# Patient Record
Sex: Female | Born: 1990 | Race: Black or African American | Hispanic: No | Marital: Single | Smoking: Never smoker
Health system: Southern US, Community
[De-identification: ages and names within clinical notes are randomized; demographics above are authoritative.]

## PROBLEM LIST (undated history)

## (undated) HISTORY — PX: GASTRIC BYPASS: SHX52

## (undated) HISTORY — PX: LARYNGEAL PAPILLOMA EXCISION LASER W/ MLB: SHX1942

## (undated) HISTORY — PX: CHOLECYSTECTOMY: SHX55

---
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
## 2008-01-27 ENCOUNTER — Emergency Department: Payer: Self-pay | Admitting: Emergency Medicine

## 2008-01-28 ENCOUNTER — Emergency Department: Payer: Self-pay | Admitting: Emergency Medicine

## 2008-04-16 ENCOUNTER — Emergency Department: Payer: Self-pay | Admitting: Unknown Physician Specialty

## 2008-12-04 ENCOUNTER — Emergency Department: Payer: Self-pay | Admitting: Internal Medicine

## 2009-02-06 ENCOUNTER — Emergency Department: Payer: Self-pay | Admitting: Emergency Medicine

## 2011-08-02 ENCOUNTER — Emergency Department: Payer: Self-pay | Admitting: Emergency Medicine

## 2013-10-23 ENCOUNTER — Emergency Department: Payer: Self-pay | Admitting: Emergency Medicine

## 2014-02-15 ENCOUNTER — Emergency Department: Payer: Self-pay | Admitting: Internal Medicine

## 2015-01-05 ENCOUNTER — Emergency Department
Admit: 2015-01-05 | Disposition: A | Discharge status: Home / Self Care | Payer: Self-pay | Admitting: Emergency Medicine

## 2016-12-06 ENCOUNTER — Emergency Department: Payer: No Typology Code available for payment source

## 2016-12-06 ENCOUNTER — Encounter: Payer: Self-pay | Admitting: Emergency Medicine

## 2016-12-06 ENCOUNTER — Emergency Department
Admission: EM | Admit: 2016-12-06 | Discharge: 2016-12-06 | Disposition: A | Discharge status: Home / Self Care | Payer: No Typology Code available for payment source | Attending: Emergency Medicine | Admitting: Emergency Medicine

## 2016-12-06 DIAGNOSIS — R51 Headache: Secondary | ICD-10-CM | POA: Diagnosis not present

## 2016-12-06 DIAGNOSIS — M542 Cervicalgia: Secondary | ICD-10-CM | POA: Diagnosis not present

## 2016-12-06 DIAGNOSIS — M7918 Myalgia, other site: Secondary | ICD-10-CM

## 2016-12-06 DIAGNOSIS — S199XXA Unspecified injury of neck, initial encounter: Secondary | ICD-10-CM | POA: Diagnosis present

## 2016-12-06 DIAGNOSIS — M545 Low back pain: Secondary | ICD-10-CM | POA: Insufficient documentation

## 2016-12-06 DIAGNOSIS — Y999 Unspecified external cause status: Secondary | ICD-10-CM | POA: Diagnosis not present

## 2016-12-06 DIAGNOSIS — Y92410 Unspecified street and highway as the place of occurrence of the external cause: Secondary | ICD-10-CM | POA: Insufficient documentation

## 2016-12-06 DIAGNOSIS — Y9389 Activity, other specified: Secondary | ICD-10-CM | POA: Diagnosis not present

## 2016-12-06 MED ORDER — NAPROXEN 500 MG PO TABS: 500.0000 mg | ORAL_TABLET | Freq: Two times a day (BID) | ORAL | 0 refills | Status: AC

## 2016-12-06 MED ORDER — IBUPROFEN 600 MG PO TABS
600.0000 mg | ORAL_TABLET | Freq: Once | ORAL | Status: AC
  Administered 2016-12-06: 600 mg via ORAL
  Filled 2016-12-06: qty 1

## 2016-12-06 MED ORDER — CYCLOBENZAPRINE HCL 5 MG PO TABS: 5.0000 mg | ORAL_TABLET | Freq: Three times a day (TID) | ORAL | 0 refills | Status: AC | PRN
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
## 2019-04-26 ENCOUNTER — Other Ambulatory Visit: Payer: Self-pay

## 2019-04-26 DIAGNOSIS — Z20822 Contact with and (suspected) exposure to covid-19: Secondary | ICD-10-CM

## 2019-04-27 LAB — NOVEL CORONAVIRUS, NAA: SARS-CoV-2, NAA: DETECTED — AB

## 2019-05-30 ENCOUNTER — Other Ambulatory Visit: Payer: Self-pay

## 2019-06-03 ENCOUNTER — Ambulatory Visit (LOCAL_COMMUNITY_HEALTH_CENTER): Payer: Self-pay

## 2019-06-03 ENCOUNTER — Other Ambulatory Visit: Payer: Self-pay

## 2019-06-03 DIAGNOSIS — Z111 Encounter for screening for respiratory tuberculosis: Secondary | ICD-10-CM

## 2019-06-06 ENCOUNTER — Other Ambulatory Visit: Payer: Self-pay

## 2019-06-06 ENCOUNTER — Ambulatory Visit (LOCAL_COMMUNITY_HEALTH_CENTER): Payer: Self-pay

## 2019-06-06 DIAGNOSIS — Z111 Encounter for screening for respiratory tuberculosis: Secondary | ICD-10-CM

## 2019-06-06 LAB — TB SKIN TEST
Induration: 0 mm
TB Skin Test: NEGATIVE

## 2019-12-20 ENCOUNTER — Ambulatory Visit: Payer: Medicaid Other | Attending: Internal Medicine

## 2019-12-20 ENCOUNTER — Other Ambulatory Visit: Payer: Self-pay

## 2019-12-20 DIAGNOSIS — Z23 Encounter for immunization: Secondary | ICD-10-CM

## 2019-12-20 NOTE — Progress Notes (Signed)
   Covid-19 Vaccination Clinic  Name:  JOY REIGER    MRN: [id number] DOB: 05/04/1991  12/20/2019  Ms. Elizondo was observed post Covid-19 immunization for 15 minutes without incident. She was provided with Vaccine Information Sheet and instruction to access the V-Safe system.   Ms. Junkins was instructed to call 911 with any severe reactions post vaccine: Marland Kitchen Difficulty breathing  . Swelling of face and throat  . A fast heartbeat  . A bad rash all over body  . Dizziness and weakness   Immunizations Administered    Name Date Dose VIS Date Route   Pfizer COVID-19 Vaccine 12/20/2019  9:02 AM 0.3 mL 08/22/2019 Intramuscular   Manufacturer: [employer], [employer]   Lot: [id number]   NDC: [address]-2

## 2020-01-14 ENCOUNTER — Ambulatory Visit: Payer: Medicaid Other | Attending: Internal Medicine

## 2020-01-14 DIAGNOSIS — Z23 Encounter for immunization: Secondary | ICD-10-CM

## 2020-01-14 NOTE — Progress Notes (Signed)
   Covid-19 Vaccination Clinic  Name:  Robin Newton    MRN: [id number] DOB: [birth year]  01/14/2020  [name] was observed post Covid-19 immunization for 15 minutes without incident. She was provided with Vaccine Information Sheet and instruction to access the V-Safe system.   [name] was instructed to call 911 with any severe reactions post vaccine: Marland Kitchen Difficulty breathing  . Swelling of face and throat  . A fast heartbeat  . A bad rash all over body  . Dizziness and weakness   Immunizations Administered    Name Date Dose VIS Date Route   Pfizer COVID-19 Vaccine 01/14/2020  4:31 PM 0.3 mL 11/05/2018 Intramuscular   Manufacturer: [employer], [employer]   Lot: [id number]   NDC: [address]-3

## 2020-08-30 ENCOUNTER — Other Ambulatory Visit: Payer: Self-pay

## 2020-08-30 ENCOUNTER — Emergency Department
Admission: EM | Admit: 2020-08-30 | Discharge: 2020-08-30 | Disposition: A | Discharge status: Home / Self Care | Payer: BLUE CROSS/BLUE SHIELD | Attending: Internal Medicine | Admitting: Internal Medicine

## 2020-08-30 ENCOUNTER — Encounter: Payer: Self-pay | Admitting: Emergency Medicine

## 2020-08-30 DIAGNOSIS — N3000 Acute cystitis without hematuria: Secondary | ICD-10-CM | POA: Diagnosis not present

## 2020-08-30 DIAGNOSIS — R300 Dysuria: Secondary | ICD-10-CM | POA: Diagnosis present

## 2020-08-30 LAB — URINALYSIS, ROUTINE W REFLEX MICROSCOPIC
Bilirubin Urine: NEGATIVE
Glucose, UA: NEGATIVE mg/dL
Ketones, ur: 5 mg/dL — AB
Nitrite: NEGATIVE
Protein, ur: 100 mg/dL — AB
Specific Gravity, Urine: 1.027 (ref 1.005–1.030)
pH: 5 (ref 5.0–8.0)

## 2020-08-30 LAB — PREGNANCY, URINE: Preg Test, Ur: NEGATIVE

## 2020-08-30 MED ORDER — NITROFURANTOIN MONOHYD MACRO 100 MG PO CAPS
100.0000 mg | ORAL_CAPSULE | Freq: Once | ORAL | Status: AC
  Administered 2020-08-30: 100 mg via ORAL
  Filled 2020-08-30: qty 1

## 2020-08-30 MED ORDER — NITROFURANTOIN MONOHYD MACRO 100 MG PO CAPS: 100.0000 mg | ORAL_CAPSULE | Freq: Two times a day (BID) | ORAL | 0 refills | Status: AC

## 2020-08-30 NOTE — Discharge Instructions (Signed)
You were seen today for acute urinary urgency and retention, consistent with a urinary tract infection.  We gave you your first dose of antibiotics in the ER.  I am giving a prescription for Macrobid to take twice daily for the next 3 days.  Please drink plenty of water.  Follow-up for new or worsening symptoms.

## 2020-08-30 NOTE — ED Notes (Signed)
Called lab to see why urine had not resulted. They have not ran test yet, did not see orders. Per Jaynie Collins they will run urine now.

## 2020-08-30 NOTE — ED Triage Notes (Signed)
Pt to ED via POV with c/o dysuria x 3 days. Pt states burning with urination at this time.

## 2020-08-30 NOTE — ED Provider Notes (Addendum)
[facility] Emergency Department Provider Note ____________________________________________  Time seen: 1510  I have reviewed the triage vital signs and the nursing notes.  HISTORY  Chief Complaint  Dysuria   HPI Robin Newton is a 29 y.o. female presents to the ER today with c/o urinary urgency and retention. This started last night. She denies frequency, dysuria, or blood in her urine. She denies vaginal discharge, odor, abnormal uterine bleeding. 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 She denies fever, chills, low back pain or lower abdominal pain. She is sexually active but has no concerns about STI's at this time.  History reviewed. No pertinent past medical history.  There are no problems to display for this patient.   Past Surgical History:  Procedure Laterality Date  . CHOLECYSTECTOMY    . LARYNGEAL PAPILLOMA EXCISION LASER W/ MLB     At age 80 and 80    Prior to Admission medications   Medication Sig Start Date End Date Taking? Authorizing Provider  nitrofurantoin, macrocrystal-monohydrate, (MACROBID) 100 MG capsule Take 1 capsule (100 mg total) by mouth 2 (two) times daily for 7 days. 08/30/20 09/06/20  Lorre Munroe, NP    Allergies Patient has no known allergies.  History reviewed. No pertinent family history.  Social History Social History   Tobacco Use  . Smoking status: Never Smoker  . Smokeless tobacco: Never Used  Substance Use Topics  . Alcohol use: No  . Drug use: No    Review of Systems  Constitutional: Negative for fever, chills or body aches. Cardiovascular: Negative for chest pain or chest tightness. Respiratory: Negative for cough or shortness of breath. Gastrointestinal: Negative for abdominal pain, nausea or vomiting. Genitourinary: Positive for urinary urgency and retention. Denies frequency, dysuria, blood in her urine, vaginal  discharge, odor or abnormal bleeding.  Skin: Negative for rash or lesion.  ____________________________________________  PHYSICAL EXAM:  VITAL SIGNS: ED Triage Vitals  Enc Vitals Group     BP 08/30/20 1405 132/80     Pulse Rate 08/30/20 1405 74     Resp 08/30/20 1405 19     Temp 08/30/20 1405 98.6 F (37 C)     Temp Source 08/30/20 1405 Oral     SpO2 08/30/20 1405 100 %     Weight 08/30/20 1408 (!) 348 lb (157.9 kg)     Height 08/30/20 1408 5\' 5"  (1.651 m)     Head Circumference --      Peak Flow --      Pain Score 08/30/20 1410 3     Pain Loc --      Pain Edu? --      Excl. in GC? --     Constitutional: Alert and oriented. Obese, in no distress. Head: Normocephalic and atraumatic. Eyes: Conjunctivae are normal. PERRL. Normal extraocular movements Cardiovascular: Normal rate, regular rhythm.  Respiratory: Normal respiratory effort. No wheezes/rales/rhonchi. Gastrointestinal: Soft and nontender. No distention. No CVA tenderness noted.  Neurologic:  Normal gait without ataxia. Normal speech and language. No gross focal neurologic deficits are appreciated.  ____________________________________________ LABS  Urinalysis    Component Value Date/Time   COLORURINE YELLOW (A) 08/30/2020 1523   APPEARANCEUR TURBID (A) 08/30/2020 1523   LABSPEC 1.027 08/30/2020 1523   PHURINE 5.0 08/30/2020 1523   GLUCOSEU NEGATIVE 08/30/2020 1523   HGBUR SMALL (A) 08/30/2020 1523   BILIRUBINUR NEGATIVE 08/30/2020 1523   KETONESUR 5 (A) 08/30/2020 1523  PROTEINUR 100 (A) 08/30/2020 1523   NITRITE NEGATIVE 08/30/2020 1523   LEUKOCYTESUR LARGE (A) 08/30/2020 1523     ____________________________________________    INITIAL IMPRESSION / ASSESSMENT AND PLAN / ED COURSE  Urinary Urgency, Urinary Retention:  DDX include interstitial cystitis, UTI, kidney stone Urinalysis, reflux with culture c/w infection Macrobid 100 mg PO x 1 RX for Macrobid PO BID x 3 days Push fluids Follow up  with new PCP or return to ER for new or worsening symptoms ____________________________________________  FINAL CLINICAL IMPRESSION(S) / ED DIAGNOSES  Final diagnoses:  Acute cystitis without hematuria    Nicki Reaper, NP    Lorre Munroe, NP 08/30/20 1655    Lorre Munroe, NP 08/30/20 2043    Delton Prairie, MD 08/30/20 2314

## 2020-09-05 ENCOUNTER — Telehealth: Payer: Self-pay | Admitting: Emergency Medicine

## 2020-09-05 MED ORDER — METRONIDAZOLE 500 MG PO TABS: 500.0000 mg | ORAL_TABLET | Freq: Two times a day (BID) | ORAL | 0 refills | Status: AC

## 2020-09-05 MED ORDER — FLUCONAZOLE 100 MG PO TABS: 150.0000 mg | ORAL_TABLET | Freq: Every day | ORAL | 0 refills | Status: AC

## 2020-09-05 NOTE — Telephone Encounter (Cosign Needed)
Patient called stating that she was seen in this ED a couple of days ago.  She has been taking Macrobid for the UTI that she was diagnosed with.  She states that she was just notified on my chart that there was trichomonas in her urine.  She called and spoke with an RN, who told her that she would need a prescription for the trichomonas.  I am sending over a prescription for Flagyl for the Trichomonas and also a prescription for Diflucan for the budding yeast in her urine.

## 2020-10-16 ENCOUNTER — Encounter: Payer: Self-pay | Admitting: Emergency Medicine

## 2020-10-16 ENCOUNTER — Other Ambulatory Visit: Payer: Self-pay

## 2020-10-16 ENCOUNTER — Ambulatory Visit
Admission: EM | Admit: 2020-10-16 | Discharge: 2020-10-16 | Disposition: A | Discharge status: Home / Self Care | Payer: BLUE CROSS/BLUE SHIELD | Attending: Sports Medicine | Admitting: Sports Medicine

## 2020-10-16 DIAGNOSIS — R300 Dysuria: Secondary | ICD-10-CM | POA: Diagnosis present

## 2020-10-16 DIAGNOSIS — R350 Frequency of micturition: Secondary | ICD-10-CM | POA: Diagnosis present

## 2020-10-16 DIAGNOSIS — N390 Urinary tract infection, site not specified: Secondary | ICD-10-CM | POA: Diagnosis present

## 2020-10-16 LAB — URINALYSIS, COMPLETE (UACMP) WITH MICROSCOPIC
Glucose, UA: NEGATIVE mg/dL
Ketones, ur: 15 mg/dL — AB
Nitrite: NEGATIVE
Protein, ur: 30 mg/dL — AB
Specific Gravity, Urine: >1.03 — ABNORMAL HIGH (ref 1.005–1.030)
WBC, UA: >50 WBC/hpf (ref 0–5)
pH: 5.5 (ref 5.0–8.0)

## 2020-10-16 MED ORDER — SULFAMETHOXAZOLE-TRIMETHOPRIM 800-160 MG PO TABS: 1.0000 | ORAL_TABLET | Freq: Two times a day (BID) | ORAL | 0 refills | Status: AC

## 2020-10-16 NOTE — ED Triage Notes (Signed)
Patient c/o urinary frequency and urgency and dysuria that started 3 days ago.  Patient denies fevers.  Patient denies N/V.

## 2020-10-16 NOTE — Discharge Instructions (Addendum)
Please take the medication as prescribed. Your urine has been sent off for culture and someone may contact you to change your antibiotics. Please flush her system with plenty of fluids including water.  Your urine shows you are dehydrated. Your urine also shows a moderate amount of bilirubin which may be secondary to your dehydration.  This was not on your last urine from December.  Please reach out to your weight loss surgeon and let them know of this finding as it may be secondary to your weight loss surgery.  I hope you get feeling better, Dr. Zachery Dauer

## 2020-10-16 NOTE — ED Provider Notes (Signed)
[facility]    CSN: [id number] Arrival date & time: 10/16/20  1429      History   Chief Complaint Chief Complaint  Patient presents with  . Urinary Frequency  . Dysuria    HPI Robin Newton is a 30 y.o. female.   Patient pleasant 30 year old female who presents for evaluation of the above issues.  She reports 3 days of increased urinary frequency, incomplete voiding, pain on urination with burning, but no hematuria.  She did have a urinary tract infection diagnosed back on 08/31/1999 and Macrobid for 3 days.  The UA did reveal trichomonas and she was also put on Flagyl.  She denies any fever shakes chills.  No significant abdominal pain.  She denies chest pain or shortness of breath.  No Covid symptoms.  No red flag signs or symptoms elicited on history.     History reviewed. No pertinent past medical history.  There are no problems to display for this patient.   Past Surgical History:  Procedure Laterality Date  . CHOLECYSTECTOMY    . GASTRIC BYPASS    . LARYNGEAL PAPILLOMA EXCISION LASER W/ MLB     At age 64 and 25    OB History    Gravida  1   Para      Term      Preterm      AB      Living        SAB      IAB      Ectopic      Multiple      Live Births               Home Medications    Prior to Admission medications   Medication Sig Start Date End Date Taking? Authorizing Provider  norethindrone (MICRONOR) 0.35 MG tablet Take by mouth. 12/30/19  Yes [provider]  sulfamethoxazole-trimethoprim (BACTRIM DS) 800-160 MG tablet Take 1 tablet by mouth 2 (two) times daily for 7 days. 10/16/20 10/23/20 Yes Delton See, MD    Family History History reviewed. No pertinent family history.  Social History Social History   Tobacco Use  . Smoking status: Never Smoker  . Smokeless tobacco: Never Used  Vaping Use  . Vaping Use: Never used  Substance Use Topics  . Alcohol use: No  . Drug use: No      Allergies   Patient has no known allergies.   Review of Systems Review of Systems  Constitutional: Negative for activity change, appetite change, chills, fatigue and fever.  HENT: Negative.   Respiratory: Negative.   Cardiovascular: Negative.   Gastrointestinal: Negative for abdominal pain, diarrhea, nausea and vomiting.  Genitourinary: Positive for dysuria, frequency and urgency. Negative for flank pain, hematuria, vaginal bleeding, vaginal discharge and vaginal pain.  Musculoskeletal: Negative for arthralgias and myalgias.  Skin: Negative for color change, pallor, rash and wound.  Neurological: Negative for dizziness, light-headedness and headaches.  All other systems reviewed and are negative.    Physical Exam Triage Vital Signs ED Triage Vitals  Enc Vitals Group     BP 10/16/20 1436 134/67     Pulse Rate 10/16/20 1436 61     Resp 10/16/20 1436 14     Temp 10/16/20 1436 97.9 F (36.6 C)     Temp Source 10/16/20 1436 Oral     SpO2 10/16/20 1436 100 %     Weight 10/16/20 1433 (!) 338 lb (153.3 kg)     Height  10/16/20 1433 5\' 5"  (1.651 m)     Head Circumference --      Peak Flow --      Pain Score 10/16/20 1432 0     Pain Loc --      Pain Edu? --      Excl. in GC? --    No data found.  Updated Vital Signs BP 134/67 (BP Location: Left Arm)   Pulse 61   Temp 97.9 F (36.6 C) (Oral)   Resp 14   Ht 5\' 5"  (1.651 m)   Wt (!) 153.3 kg   LMP 09/13/2020 (Approximate)   SpO2 100%   BMI 56.25 kg/m   Visual Acuity Right Eye Distance:   Left Eye Distance:   Bilateral Distance:    Right Eye Near:   Left Eye Near:    Bilateral Near:     Physical Exam Vitals and nursing note reviewed.  Constitutional:      General: She is not in acute distress.    Appearance: Normal appearance. She is not ill-appearing, toxic-appearing or diaphoretic.  Cardiovascular:     Rate and Rhythm: Normal rate and regular rhythm.     Pulses: Normal pulses.     Heart sounds: Normal  heart sounds. No murmur heard. No friction rub. No gallop.   Pulmonary:     Effort: Pulmonary effort is normal. No respiratory distress.     Breath sounds: Normal breath sounds. No stridor. No wheezing, rhonchi or rales.  Abdominal:     General: There is no distension.     Palpations: Abdomen is soft.     Tenderness: There is no abdominal tenderness. There is no right CVA tenderness, left CVA tenderness, guarding or rebound.  Skin:    General: Skin is warm and dry.     Capillary Refill: Capillary refill takes less than 2 seconds.  Neurological:     General: No focal deficit present.     Mental Status: She is alert and oriented to person, place, and time.      UC Treatments / Results  Labs (all labs ordered are listed, but only abnormal results are displayed) Labs Reviewed  URINALYSIS, COMPLETE (UACMP) WITH MICROSCOPIC - Abnormal; Notable for the following components:      Result Value   APPearance CLOUDY (*)    Specific Gravity, Urine >1.030 (*)    Hgb urine dipstick TRACE (*)    Bilirubin Urine MODERATE (*)    Ketones, ur 15 (*)    Protein, ur 30 (*)    Leukocytes,Ua SMALL (*)    Bacteria, UA FEW (*)    All other components within normal limits  URINE CULTURE    EKG   Radiology No results found.  Procedures Procedures (including critical care time)  Medications Ordered in UC Medications - No data to display  Initial Impression / Assessment and Plan / UC Course  I have reviewed the triage vital signs and the nursing notes.  Pertinent labs & imaging results that were available during my care of the patient were reviewed by me and considered in my medical decision making (see chart for details).  Clinical Course as of 10/17/20 1757  Sat Oct 16, 2020  1507 Bacteria, UA(!): FEW [KB]    Clinical Course User Index [KB] 12/15/20, MD   Clinical impression:  3 days of UTI symptoms including increased frequency and incomplete voiding and pain on  urination.  Treatment plan: 1.  The findings and treatment plan were discussed in  detail with the patient.  Patient was in agreement. 2.  Her UA did show some bacteria and leukocytes.  We will treat her for UTI with an antibiotic.  Sent in Bactrim to the pharmacy.  We will also send off the culture. 3.  Her UA also showed moderate bilirubin which was not there on her last UA back in December.  She is status post gastric bypass surgery and I have asked her to reach out to her surgeon just to let them know.  Her specific gravity was elevated so she is a little bit dehydrated and that may be the reason why she has bilirubin.  Either way she will contact her surgeon.  I have asked her to increase her fluid intake because of the dehydration. 4.  Educational handout was provided. 5.  Over-the-counter meds as needed, Tylenol or Motrin for fever or discomfort.  Plenty of rest and plenty of fluids. 6.  Offered a work note she said she did not need one. 7.  Follow-up here as needed.  Final Clinical Impressions(s) / UC Diagnoses   Final diagnoses:  Lower urinary tract infectious disease  Increased urinary frequency  Dysuria     Discharge Instructions     Please take the medication as prescribed. Your urine has been sent off for culture and someone may contact you to change your antibiotics. Please flush her system with plenty of fluids including water.  Your urine shows you are dehydrated. Your urine also shows a moderate amount of bilirubin which may be secondary to your dehydration.  This was not on your last urine from December.  Please reach out to your weight loss surgeon and let them know of this finding as it may be secondary to your weight loss surgery.  I hope you get feeling better, Dr. Zachery Dauer    ED Prescriptions    Medication Sig Dispense Auth. Provider   sulfamethoxazole-trimethoprim (BACTRIM DS) 800-160 MG tablet Take 1 tablet by mouth 2 (two) times daily for 7 days. 14 tablet  Delton See, MD     PDMP not reviewed this encounter.   Delton See, MD 10/17/20 [phone]

## 2020-10-18 LAB — URINE CULTURE
